# Patient Record
Sex: Female | Born: 1980 | Race: Black or African American | Hispanic: No | Marital: Married | Smoking: Current every day smoker
Health system: Southern US, Community
[De-identification: ages and names within clinical notes are randomized; demographics above are authoritative.]

## PROBLEM LIST (undated history)

## (undated) HISTORY — PX: TUBAL LIGATION: SHX77

---

## 2001-01-26 ENCOUNTER — Emergency Department (HOSPITAL_COMMUNITY): Admission: EM | Admit: 2001-01-26 | Discharge: 2001-01-26 | Payer: Self-pay | Admitting: Emergency Medicine

## 2001-02-08 ENCOUNTER — Emergency Department (HOSPITAL_COMMUNITY): Admission: EM | Admit: 2001-02-08 | Discharge: 2001-02-09 | Payer: Self-pay | Admitting: *Deleted

## 2001-04-30 ENCOUNTER — Emergency Department (HOSPITAL_COMMUNITY): Admission: EM | Admit: 2001-04-30 | Discharge: 2001-04-30 | Payer: Self-pay | Admitting: Emergency Medicine

## 2001-07-11 ENCOUNTER — Emergency Department (HOSPITAL_COMMUNITY): Admission: EM | Admit: 2001-07-11 | Discharge: 2001-07-11 | Payer: Self-pay | Admitting: Emergency Medicine

## 2004-07-15 ENCOUNTER — Emergency Department (HOSPITAL_COMMUNITY): Admission: EM | Admit: 2004-07-15 | Discharge: 2004-07-15 | Payer: Self-pay | Admitting: Emergency Medicine

## 2004-09-25 ENCOUNTER — Emergency Department (HOSPITAL_COMMUNITY): Admission: EM | Admit: 2004-09-25 | Discharge: 2004-09-25 | Payer: Self-pay | Admitting: Emergency Medicine

## 2005-06-07 ENCOUNTER — Emergency Department (HOSPITAL_COMMUNITY): Admission: EM | Admit: 2005-06-07 | Discharge: 2005-06-07 | Payer: Self-pay | Admitting: Family Medicine

## 2005-10-04 ENCOUNTER — Emergency Department (HOSPITAL_COMMUNITY): Admission: EM | Admit: 2005-10-04 | Discharge: 2005-10-04 | Payer: Self-pay | Admitting: Emergency Medicine

## 2007-06-05 ENCOUNTER — Emergency Department (HOSPITAL_COMMUNITY): Admission: EM | Admit: 2007-06-05 | Discharge: 2007-06-05 | Payer: Self-pay | Admitting: Family Medicine

## 2009-04-12 ENCOUNTER — Emergency Department (HOSPITAL_COMMUNITY): Admission: EM | Admit: 2009-04-12 | Discharge: 2009-04-12 | Payer: Self-pay

## 2011-03-13 ENCOUNTER — Emergency Department (HOSPITAL_COMMUNITY): Payer: Medicaid Other

## 2011-03-13 ENCOUNTER — Emergency Department (HOSPITAL_COMMUNITY)
Admission: EM | Admit: 2011-03-13 | Discharge: 2011-03-13 | Disposition: A | Discharge status: Home / Self Care | Payer: Medicaid Other | Attending: Emergency Medicine | Admitting: Emergency Medicine

## 2011-03-13 DIAGNOSIS — N6009 Solitary cyst of unspecified breast: Secondary | ICD-10-CM | POA: Insufficient documentation

## 2014-09-28 ENCOUNTER — Encounter (HOSPITAL_COMMUNITY): Payer: Self-pay | Admitting: Emergency Medicine

## 2014-09-28 ENCOUNTER — Emergency Department (HOSPITAL_COMMUNITY)
Admission: EM | Admit: 2014-09-28 | Discharge: 2014-09-28 | Disposition: A | Discharge status: Home / Self Care | Payer: Medicaid Other | Attending: Emergency Medicine | Admitting: Emergency Medicine

## 2014-09-28 DIAGNOSIS — I10 Essential (primary) hypertension: Secondary | ICD-10-CM

## 2014-09-28 DIAGNOSIS — K0889 Other specified disorders of teeth and supporting structures: Secondary | ICD-10-CM

## 2014-09-28 DIAGNOSIS — K088 Other specified disorders of teeth and supporting structures: Secondary | ICD-10-CM | POA: Diagnosis present

## 2014-09-28 MED ORDER — HYDROCODONE-ACETAMINOPHEN 5-325 MG PO TABS: 1.0000 | ORAL_TABLET | ORAL | Status: AC | PRN

## 2014-09-28 MED ORDER — HYDROCHLOROTHIAZIDE 25 MG PO TABS: 25.0000 mg | ORAL_TABLET | Freq: Every day | ORAL | Status: AC

## 2014-09-28 MED ORDER — OXYCODONE-ACETAMINOPHEN 5-325 MG PO TABS
1.0000 | ORAL_TABLET | Freq: Once | ORAL | Status: AC
  Administered 2014-09-28: 1 via ORAL
  Filled 2014-09-28: qty 1

## 2014-09-28 MED ORDER — IBUPROFEN 800 MG PO TABS: 800.0000 mg | ORAL_TABLET | Freq: Three times a day (TID) | ORAL | Status: AC | PRN

## 2014-09-28 MED ORDER — PENICILLIN V POTASSIUM 500 MG PO TABS: 500.0000 mg | ORAL_TABLET | Freq: Four times a day (QID) | ORAL | Status: AC

## 2014-09-28 NOTE — ED Notes (Signed)
Started 2 days ago with right lower dental pain. "I have a hole in my tooth". Pt is tearful, BP elevated.

## 2014-09-28 NOTE — Discharge Instructions (Signed)
Read the information below.  Use the prescribed medication as directed.  Please discuss all new medications with your pharmacist.  Do not take additional tylenol while taking the prescribed pain medication to avoid overdose.  You may return to the Emergency Department at any time for worsening condition or any new symptoms that concern you.  If there is any possibility that you might be pregnant, please let your health care provider know and discuss this with the pharmacist to ensure medication safety.    Please call the dentist within 48 hours to schedule a close follow up appointment.  If you develop fevers, swelling in your face, difficulty swallowing or breathing, return to the ER immediately for a recheck.   Please have your blood pressure rechecked within one week while your pain is controlled.  Follow up with a primary care provider to reevaluate your for hypertension (high blood pressure).    Dental Pain A tooth ache may be caused by cavities (tooth decay). Cavities expose the nerve of the tooth to air and hot or cold temperatures. It may come from an infection or abscess (also called a boil or furuncle) around your tooth. It is also often caused by dental caries (tooth decay). This causes the pain you are having. DIAGNOSIS  Your caregiver can diagnose this problem by exam. TREATMENT   If caused by an infection, it may be treated with medications which kill germs (antibiotics) and pain medications as prescribed by your caregiver. Take medications as directed.  Only take over-the-counter or prescription medicines for pain, discomfort, or fever as directed by your caregiver.  Whether the tooth ache today is caused by infection or dental disease, you should see your dentist as soon as possible for further care. SEEK MEDICAL CARE IF: The exam and treatment you received today has been provided on an emergency basis only. This is not a substitute for complete medical or dental care. If your  problem worsens or new problems (symptoms) appear, and you are unable to meet with your dentist, call or return to this location. SEEK IMMEDIATE MEDICAL CARE IF:   You have a fever.  You develop redness and swelling of your face, jaw, or neck.  You are unable to open your mouth.  You have severe pain uncontrolled by pain medicine. MAKE SURE YOU:   Understand these instructions.  Will watch your condition.  Will get help right away if you are not doing well or get worse. Document Released: 07/22/2005 Document Revised: 10/14/2011 Document Reviewed: 03/09/2008 [facility] Patient Information 2015 [place] [employer], [place]. This information is not intended to replace advice given to you by your health care provider. Make sure you discuss any questions you have with your health care provider.    Emergency Department Resource Guide 1) Find a Doctor and Pay Out of Pocket Although you won't have to find out who is covered by your insurance plan, it is a good idea to ask around and get recommendations. You will then need to call the office and see if the doctor you have chosen will accept you as a new patient and what types of options they offer for patients who are self-pay. Some doctors offer discounts or will set up payment plans for their patients who do not have insurance, but you will need to ask so you aren't surprised when you get to your appointment.  2) Contact Your Local Health Department Not all health departments have doctors that can see patients for sick visits, but many do, so  it is worth a call to see if yours does. If you don't know where your local health department is, you can check in your phone book. The CDC also has a tool to help you locate your state's health department, and many state websites also have listings of all of their local health departments.  3) Find a Walk-in Clinic If your illness is not likely to be very severe or complicated, you may want to try a walk in clinic.  These are popping up all over the country in pharmacies, drugstores, and shopping centers. They're usually staffed by nurse practitioners or physician assistants that have been trained to treat common illnesses and complaints. They're usually fairly quick and inexpensive. However, if you have serious medical issues or chronic medical problems, these are probably not your best option.  No Primary Care Doctor: - Call Health Connect at  [phone] - they can help you locate a primary care doctor that  accepts your insurance, provides certain services, etc. - Physician Referral Service- [phone]  Chronic Pain Problems: Organization         Address  Phone   Notes  [facility]  [phone] Patients need to be referred by their primary care doctor.   Medication Assistance: Organization         Address  Phone   Notes  [facility] Medication [facility] [address] [place] [place], [place] [address] [phone] --Must be a resident of [facility] -- Must have NO insurance coverage whatsoever (no Medicaid/ Medicare, etc.) -- The pt. MUST have a primary care doctor that directs their care regularly and follows them in the community   [facility]  [phone]   [employer]  [phone]    Agencies that provide inexpensive medical care: Organization         Address  Phone   Notes  [facility]  [phone]   [facility]    [phone]   [facility] [address] [place], [place] [address] [phone]   [facility] [address] [place] [phone]   Planned Parenthood    [phone]   [facility]    [phone]   [facility]  [address], [place] Phone:  [phone], Fax:  [phone] Hours of Operation:  9 am - 6 pm, M-F.  Also accepts Medicaid/Medicare and self-pay.  [facility]  [address], [place] Phone: [phone], Fax: [phone]. Hours of Operation:  8:30 am - 5:30 pm, M-F.  Also accepts Medicaid and self-pay.  [facility] [place] [address], [place] Phone: [phone]   [facility] [address] Natasha Bence [place], [place] [phone], Ext. [phone] Mondays & Thursdays: 7-9 AM.  First 15 patients are seen on a first come, first serve basis.    Medicaid-accepting [facility] Providers:  Organization         Address  Phone   Notes  [facility] [address], [place] [phone] Also accepts self-pay patients.  [facility] [address] Laurell Josephs [place], [place]  [phone]   [facility] [address], [place] [phone]   [facility] [address] [place] [phone]   Renaye Rakers [address], [place]   [phone] Only accepts [place] patients after they have their name applied to their card.   Self-Pay (no insurance) in [facility]:  Organization         Address  Phone   Notes  Sickle Cell Patients, [facility] [address] [place], [place] [phone]   [facility] [address] [place], [place] [phone]   [facility] [place]  [address], [place] [phone]   [facility]/Dr. Osei-Bonsu  [address] [place] or [address], [place] [phone] Phone number for both [place] and [place] locations is the same.  [facility] [address], [place] [phone]   [facility] [address] [place] or [address] [phone]   [facility] [address] [place] [phone], phone; [phone], fax Sees patients 1st  and 3rd Saturday of every month.  Must not qualify for public or private insurance (i.e. Medicaid, Medicare, [place] Health Choice, Veterans' Benefits)  Household income should be no more than 200% of the poverty level The clinic cannot treat you if you are pregnant or think you are pregnant  Sexually transmitted diseases are not treated at the clinic.    Dental Care: Organization         Address  Phone  Notes  [facility] [address] [place], [place] [phone] Accepts children up to age 76 who are enrolled in [place] or [place] Health Choice; pregnant women with a Medicaid card; and children who have applied for Medicaid or [place] Health Choice, but were declined, whose parents can pay a reduced fee at time of service.  [facility]  [address], [place] [phone] Accepts children up to age 34 who are enrolled in [place] or [place] Health Choice; pregnant women with a Medicaid card; and children who have applied for Medicaid or [place] Health Choice, but were declined, whose parents can pay a reduced fee at time of service.  [facility]  [address] [place], [place] [phone] Patients are seen by appointment only. Walk-ins are not accepted. [facility] will see patients 54 years of age and older. Monday - Tuesday (8am-5pm) Most Wednesdays (8:30-5pm) $30 per visit, cash only  [facility] Adult Dental Access PRO[facility]  [address], [facility] [phone] Patients are seen by appointment only. Walk-ins are not accepted. [facility] will see patients 49 years of age and older. One Wednesday Evening (Monthly: Volunteer Based).  $30 per visit, cash only  [employer]  [phone] for adults; Children under age 62, call Graduate Pediatric Dentistry at [phone]. Children aged 68-14, please call [phone] to request a pediatric  application.  Dental services are provided in all areas of dental care including fillings, crowns and bridges, complete and partial dentures, implants, gum treatment, root canals, and extractions. Preventive care is also provided. Treatment is provided to both adults and children. Patients are selected via a lottery and there is often a waiting list.   [facility] [address], [place]  [phone] [email or web address]   [facility] [address] [place], [place] [phone], Ext. [phone] Second and Fourth  Thursday of each month, opens at 6:30 AM; Clinic ends at 9 AM.  Patients are seen on a first-come first-served basis, and a limited number are seen during each clinic.   [facility]  [address] Ether Griffins [place], [place] [phone]   Eligibility Requirements You must have lived in [place], [place], or [place] counties for at least the last three months.   You cannot be eligible for state or federal sponsored [place], including [employer], [place], or [employer].   You generally cannot be eligible for healthcare insurance through your employer.    How to apply: Eligibility screenings are held every Tuesday and Wednesday afternoon from 1:00 pm until 4:00 pm. You do not need an appointment for the interview!  [facility] [address], [place], [place] [phone]   [facility]  [phone]   [facility] Health Department  [phone]   [facility] Health Department  [phone]    Behavioral Health Resources in the Community: Intensive Outpatient Programs Organization         Address  Phone  Notes  [facility] Services [address] [place] [address] [place], [place] [phone]   [facility] Outpatient [address], [place], [place] [phone]   [facility] [address] [place], [place]  [phone]   [facility] Mental Health  [address]  [place], [place] [phone] or [phone]   Substance Abuse Resources Organization         Address  Phone  Notes  [facility] Drug Services  [phone]   [facility]  [phone]   [facility] [place]  [phone]   Floydene Flock  [phone]   Residential & Outpatient Substance Abuse Program  [phone]   Psychological Services Organization         Address  Phone  Notes  [facility] Health  [phone]   [facility] Services  [phone]   [facility] Mental Health [address], [place] [phone] or [phone]    Mobile Crisis Teams Organization         Address  Phone  Notes  [facility], Mobile Crisis Care Unit  [phone]   [facility]  [address] [place], [place] [phone]   Doristine Locks [address] [place] [phone]    Self-Help/Support Groups Organization         Address  Phone             Notes  [facility] [place] - variety of support groups  336- I7437963 Call for more information  Narcotics Anonymous (NA), [facility] [address], [employer] [place]  2 meetings at this location   Statistician         Address  Phone  Notes  [facility] [address],    [place]  [phone]   [facility]  [address] [place] [address], [place], [place] [phone]   [facility] [address] [place], [place] [phone] Admissions: 8am-3pm M-F  [facility] [address],    [place], [place] [phone]   [facility] [address] Starling Manns [place], [place] [phone]   [facility] [address]  [place], [place] [phone]   [facility] - Intensive Outpatient [address] Laurell Joseph[address], [place], [place] [phone]   [facility] ([facility] [phone] [address]  [place], [place] [phone] or [phone]   [facility] ([facility]) [address], [place], [place] Accepts Medicaid  [facility] [place] [address],  [place] [phone] Substance Abuse/Addiction Treatment   [facility] Organization         Address  Phone  Notes  [facility]  [phone]   Domenic Schwab, PhD [address] Arlis Porta [place], [place]   [phone] or [phone]   [place], [place] [phone]   [address], [place], [place] [phone] Insurance/Medicaid/sponsorship through [facility] [address]                                    [place], [place] [phone] [place] [address], [place] [phone]    Dr. Adele Schilder  [phone]   [facility] Dept. 1) [address] [place] 2) [place] [place] [address])  [address], [place] [phone]  [phone]   [place] [phone] or [phone] (After Hours)

## 2014-09-28 NOTE — ED Notes (Signed)
PA notified of BP 

## 2014-09-28 NOTE — ED Provider Notes (Signed)
CSN: [id number]     Arrival date & time 09/28/14  4098 History  This chart was scribed for Trixie Dredge, PA-C, working with Richardean Canal, MD by Leona Carry, ED Scribe. The patient was seen in TR10C/TR10C. The patient's care was started at 10:02 AM.     Chief Complaint  Patient presents with  . Dental Pain   HPI HPI Comments: Hannah Carter is a 34 y.o. female who presents to the Emergency Department complaining of severe, sharp dental pain beginning 3-4 days ago. She characterizes the pain as a 10/10 in severity. Patient reports that she has "had a hole" in one of her lower right molars but denies any pain until 3-4 days ago. Patient states that the pain radiates from her right jaw to her ear. She denies nausea, vomiting, or trouble breathing, trouble swallowing, fever, or chills.  Patient does not have a PCP.   No past medical history on file. No past surgical history on file. No family history on file. History  Substance Use Topics  . Smoking status: Not on file  . Smokeless tobacco: Not on file  . Alcohol Use: Not on file   OB History    No data available     Review of Systems  Constitutional: Negative for fever and chills.  HENT: Positive for dental problem and facial swelling. Negative for trouble swallowing.   Respiratory: Negative for shortness of breath.   Gastrointestinal: Negative for nausea and vomiting.  Musculoskeletal: Negative for neck pain and neck stiffness.  Skin: Negative for color change.  Allergic/Immunologic: Negative for immunocompromised state.  Psychiatric/Behavioral: Negative for self-injury.      Allergies  Review of patient's allergies indicates not on file.  Home Medications   Prior to Admission medications   Not on File   Triage Vitals: BP 193/117 mmHg  Pulse 96  Temp(Src) 98.3 F (36.8 C) (Oral)  Resp 20  SpO2 100%  LMP 09/16/2014 Physical Exam  Constitutional: She appears well-developed and well-nourished. No distress.   HENT:  Head: Normocephalic and atraumatic.  Right Ear: Tympanic membrane and ear canal normal.  Mouth/Throat: Uvula is midline and oropharynx is clear and moist. Mucous membranes are not dry. No uvula swelling. No oropharyngeal exudate, posterior oropharyngeal edema, posterior oropharyngeal erythema or tonsillar abscesses.  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 No sinus tenderness.  Neck: Trachea normal, normal range of motion and phonation normal. Neck supple. No tracheal tenderness present. No rigidity. No tracheal deviation, no edema, no erythema and normal range of motion present.  Pulmonary/Chest: No stridor.  Lymphadenopathy:    She has no cervical adenopathy.  Neurological: She is alert.  Skin: She is not diaphoretic.  Nursing note and vitals reviewed.   ED Course  Procedures (including critical care time) DIAGNOSTIC STUDIES: Oxygen Saturation is 100% on room air, normal by my interpretation.    COORDINATION OF CARE: 10:06 AM-Discussed treatment plan which includes Percocet/Roxicet and Penicillin with pt at bedside and pt agreed to plan.    Labs Review Labs Reviewed - No data to display  Imaging Review No results found.   EKG Interpretation None      MDM   Final diagnoses:  Pain, dental  Essential hypertension    Afebrile, nontoxic patient with new dental pain.  No obvious abscess.  No concerning findings on exam.  Doubt deep space head or neck infection.  Doubt Ludwig's angina.  D/C home with antibiotic, pain medication and dental follow up.  There is  no dentist or oral surgeon on call, resources given.  Discussed findings, treatment, and follow up  with patient.  Pt given return precautions.  Pt verbalizes understanding and agrees with plan.       I personally performed the services described in this documentation, which was scribed in my presence. The recorded information has been reviewed and is accurate.    Trixie Dredgemily Nabiha Planck, PA-C 09/28/14 1054  Richardean Canalavid H Yao,  MD 09/29/14 [phone]

## 2019-07-27 ENCOUNTER — Ambulatory Visit: Payer: Medicaid Other | Attending: Internal Medicine

## 2019-07-27 DIAGNOSIS — Z20822 Contact with and (suspected) exposure to covid-19: Secondary | ICD-10-CM

## 2019-07-29 LAB — NOVEL CORONAVIRUS, NAA: SARS-CoV-2, NAA: NOT DETECTED

## 2019-10-29 ENCOUNTER — Encounter (HOSPITAL_COMMUNITY): Payer: Self-pay

## 2019-10-29 ENCOUNTER — Ambulatory Visit (HOSPITAL_COMMUNITY)
Admission: EM | Admit: 2019-10-29 | Discharge: 2019-10-29 | Disposition: A | Discharge status: Home / Self Care | Payer: Medicaid Other

## 2019-10-29 ENCOUNTER — Ambulatory Visit (INDEPENDENT_AMBULATORY_CARE_PROVIDER_SITE_OTHER): Payer: Medicaid Other

## 2019-10-29 ENCOUNTER — Other Ambulatory Visit: Payer: Self-pay

## 2019-10-29 DIAGNOSIS — R0781 Pleurodynia: Secondary | ICD-10-CM

## 2019-10-29 DIAGNOSIS — R079 Chest pain, unspecified: Secondary | ICD-10-CM

## 2019-10-29 MED ORDER — CYCLOBENZAPRINE HCL 5 MG PO TABS: 5.0000 mg | ORAL_TABLET | Freq: Every day | ORAL | 0 refills | Status: AC

## 2019-10-29 MED ORDER — NAPROXEN 500 MG PO TABS: 500.0000 mg | ORAL_TABLET | Freq: Two times a day (BID) | ORAL | 0 refills | Status: AC

## 2019-10-29 NOTE — Discharge Instructions (Signed)
Your xray is normal today.  We will try an anti-inflammatory and muscle relaxer to see if this is helpful with your symptoms.  Take the muscle relaxer at night as may cause drowsiness.  Naproxen may be taken at night, or may help to be taken twice a day. Take with food to prevent stomach upset.  Follow up with a primary care provider for recheck if symptoms persist.

## 2019-10-29 NOTE — ED Triage Notes (Signed)
Pt c/o "aching" left thoracic and left rib pain for approx 1 month. States she cannot recall a direct injury or trauma to the area. Pain increases with raising left arm. Denies SOB, cough, or numbness to left arm. Pt able to twist as describes location.

## 2019-10-29 NOTE — ED Provider Notes (Signed)
[place]    CSN: [id number] Arrival date & time: 10/29/19  9622      History   Chief Complaint Chief Complaint  Patient presents with  . Back Pain    HPI Hannah Carter is a 39 y.o. female.   Hannah Carter presents with complaints of left lateral rib pain which she has been experiencing for months now which is worse at night primarily. Certain positions and movements seem to trigger it. Has tried tylenol which hasn't helped with pain. No specific known injury. She does not work. No shortness of breath , no dizziness. Only mild pain currently but noted it last night, preventing her from sleeping well. No anterior chest pain, no heart burn. No cardiac history.     ROS per HPI, negative if not otherwise mentioned.      History reviewed. No pertinent past medical history.  There are no problems to display for this patient.   Past Surgical History:  Procedure Laterality Date  . TUBAL LIGATION      OB History   No obstetric history on file.      Home Medications    Prior to Admission medications   Medication Sig Start Date End Date Taking? Authorizing Provider  acetaminophen (TYLENOL) 500 MG tablet Take 500 mg by mouth every 6 (six) hours as needed.   Yes [provider]  cyclobenzaprine (FLEXERIL) 5 MG tablet Take 1 tablet (5 mg total) by mouth at bedtime. 10/29/19   Zigmund Gottron, NP  hydrochlorothiazide (HYDRODIURIL) 25 MG tablet Take 1 tablet (25 mg total) by mouth daily. 09/28/14   Clayton Bibles, PA-C  HYDROcodone-acetaminophen (NORCO/VICODIN) 5-325 MG per tablet Take 1 tablet by mouth every 4 (four) hours as needed for moderate pain or severe pain. 09/28/14   Clayton Bibles, PA-C  ibuprofen (ADVIL,MOTRIN) 800 MG tablet Take 1 tablet (800 mg total) by mouth every 8 (eight) hours as needed for mild pain or moderate pain. 09/28/14   Clayton Bibles, PA-C  naproxen (NAPROSYN) 500 MG tablet Take 1 tablet (500 mg total) by mouth 2 (two) times daily.  10/29/19   Zigmund Gottron, NP    Family History Family History  Problem Relation Age of Onset  . Healthy Mother   . Heart failure Father     Social History Social History   Tobacco Use  . Smoking status: Current Every Day Smoker  Substance Use Topics  . Alcohol use: Yes  . Drug use: Yes    Types: Marijuana     Allergies   Patient has no known allergies.   Review of Systems Review of Systems   Physical Exam Triage Vital Signs ED Triage Vitals  Enc Vitals Group     BP 10/29/19 0836 136/89     Pulse Rate 10/29/19 0836 93     Resp 10/29/19 0836 16     Temp 10/29/19 0836 97.9 F (36.6 C)     Temp Source 10/29/19 0836 Oral     SpO2 10/29/19 0836 100 %     Weight --      Height --      Head Circumference --      Peak Flow --      Pain Score 10/29/19 0832 6     Pain Loc --      Pain Edu? --      Excl. in [place]? --    No data found.  Updated Vital Signs BP 136/89 (BP Location: Left Arm)   Pulse  93   Temp 97.9 F (36.6 C) (Oral)   Resp 16   LMP 10/08/2019 (Exact Date)   SpO2 100%    Physical Exam Constitutional:      General: She is not in acute distress.    Appearance: She is well-developed.  Cardiovascular:     Rate and Rhythm: Normal rate and regular rhythm.  Pulmonary:     Effort: Pulmonary effort is normal.     Breath sounds: Normal breath sounds.  Chest:     Chest wall: Tenderness present. No crepitus or edema. There is no dullness to percussion.       Comments: Left lateral ribs and musculature with tenderness on palpation  Skin:    General: Skin is warm and dry.  Neurological:     Mental Status: She is alert and oriented to person, place, and time.      UC Treatments / Results  Labs (all labs ordered are listed, but only abnormal results are displayed) Labs Reviewed - No data to display  EKG   Radiology DG Ribs Unilateral W/Chest Left  Result Date: 10/29/2019 CLINICAL DATA:  Pain EXAM: LEFT RIBS AND CHEST - 3+ VIEW COMPARISON:   Chest radiograph March 13, 2011. FINDINGS: Frontal chest as well as oblique and cone-down rib images obtained. Lungs are clear. Heart size and pulmonary vascularity are normal. No adenopathy. There is no evident pneumothorax or pleural effusion. No evident rib fracture. IMPRESSION: No evident rib fracture.  Lungs clear. Electronically Signed   By: Bretta Bang III M.D.   On: 10/29/2019 09:08    Procedures Procedures (including critical care time)  Medications Ordered in UC Medications - No data to display  Initial Impression / Assessment and Plan / UC Course  I have reviewed the triage vital signs and the nursing notes.  Pertinent labs & imaging results that were available during my care of the patient were reviewed by me and considered in my medical decision making (see chart for details).     Rib/ chest film normal today. Symptoms for months now. Primarily at night to left lateral chest wall. Reproducible with palpation during exam. Worse with movement over night. Pain management discussed. Encouraged establish with PCP. Return precautions provided. Patient verbalized understanding and agreeable to plan.   Final Clinical Impressions(s) / UC Diagnoses   Final diagnoses:  Rib pain on left side     Discharge Instructions     Your xray is normal today.  We will try an anti-inflammatory and muscle relaxer to see if this is helpful with your symptoms.  Take the muscle relaxer at night as may cause drowsiness.  Naproxen may be taken at night, or may help to be taken twice a day. Take with food to prevent stomach upset.  Follow up with a primary care provider for recheck if symptoms persist.    ED Prescriptions    Medication Sig Dispense Auth. Provider   cyclobenzaprine (FLEXERIL) 5 MG tablet Take 1 tablet (5 mg total) by mouth at bedtime. 15 tablet Linus Mako B, NP   naproxen (NAPROSYN) 500 MG tablet Take 1 tablet (500 mg total) by mouth 2 (two) times daily. 30 tablet Georgetta Haber, NP     PDMP not reviewed this encounter.   Georgetta Haber, NP 10/29/19 1022

## 2020-02-03 DIAGNOSIS — Z419 Encounter for procedure for purposes other than remedying health state, unspecified: Secondary | ICD-10-CM | POA: Diagnosis not present

## 2020-03-05 DIAGNOSIS — Z419 Encounter for procedure for purposes other than remedying health state, unspecified: Secondary | ICD-10-CM | POA: Diagnosis not present

## 2020-04-05 DIAGNOSIS — Z419 Encounter for procedure for purposes other than remedying health state, unspecified: Secondary | ICD-10-CM | POA: Diagnosis not present

## 2020-05-05 DIAGNOSIS — Z419 Encounter for procedure for purposes other than remedying health state, unspecified: Secondary | ICD-10-CM | POA: Diagnosis not present

## 2020-06-05 DIAGNOSIS — Z419 Encounter for procedure for purposes other than remedying health state, unspecified: Secondary | ICD-10-CM | POA: Diagnosis not present

## 2020-07-05 DIAGNOSIS — Z419 Encounter for procedure for purposes other than remedying health state, unspecified: Secondary | ICD-10-CM | POA: Diagnosis not present

## 2020-08-05 DIAGNOSIS — Z419 Encounter for procedure for purposes other than remedying health state, unspecified: Secondary | ICD-10-CM | POA: Diagnosis not present

## 2020-09-05 DIAGNOSIS — Z419 Encounter for procedure for purposes other than remedying health state, unspecified: Secondary | ICD-10-CM | POA: Diagnosis not present

## 2020-10-03 DIAGNOSIS — Z419 Encounter for procedure for purposes other than remedying health state, unspecified: Secondary | ICD-10-CM | POA: Diagnosis not present

## 2020-11-03 DIAGNOSIS — Z419 Encounter for procedure for purposes other than remedying health state, unspecified: Secondary | ICD-10-CM | POA: Diagnosis not present

## 2020-12-03 DIAGNOSIS — Z419 Encounter for procedure for purposes other than remedying health state, unspecified: Secondary | ICD-10-CM | POA: Diagnosis not present

## 2020-12-04 IMAGING — DX DG RIBS W/ CHEST 3+V*L*
3 series · 3 of 3 positions shown · non-contrast
Comparison: Chest radiograph March 13, 2011.

CLINICAL DATA: Pain

EXAM:
LEFT RIBS AND CHEST - 3+ VIEW

[chest pa]
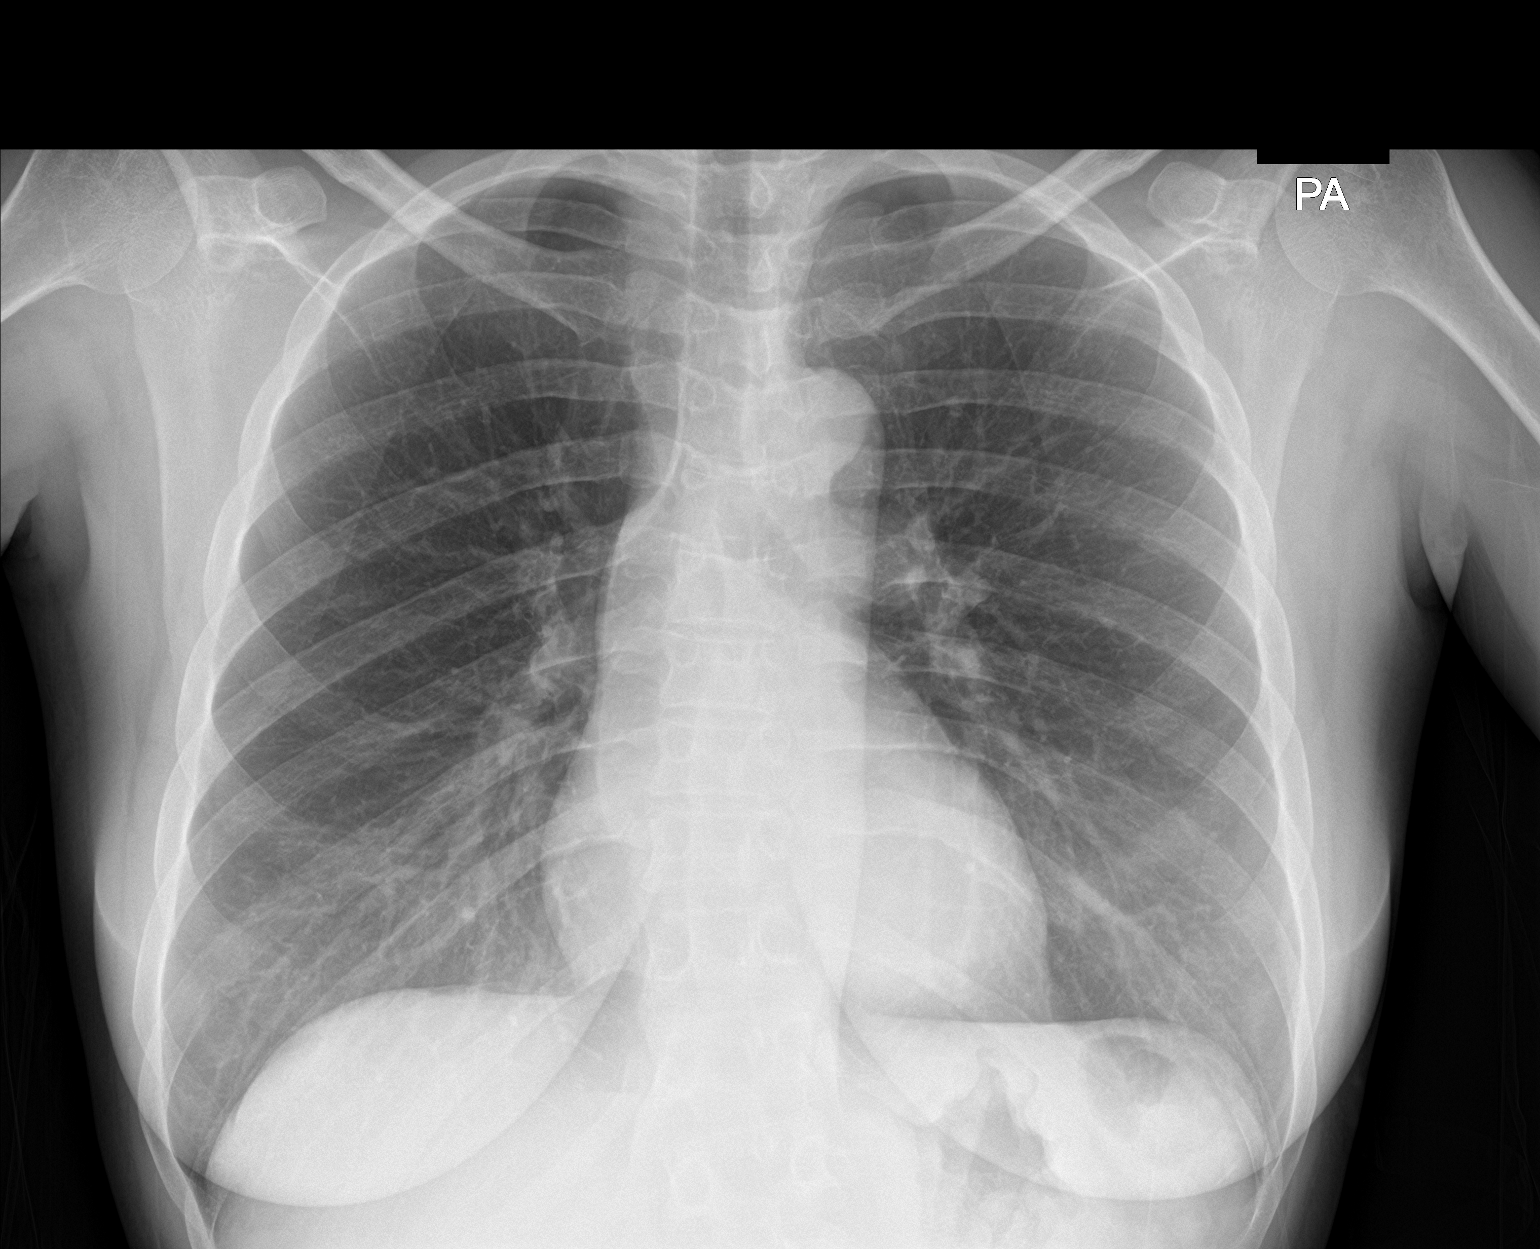

[rib obl]
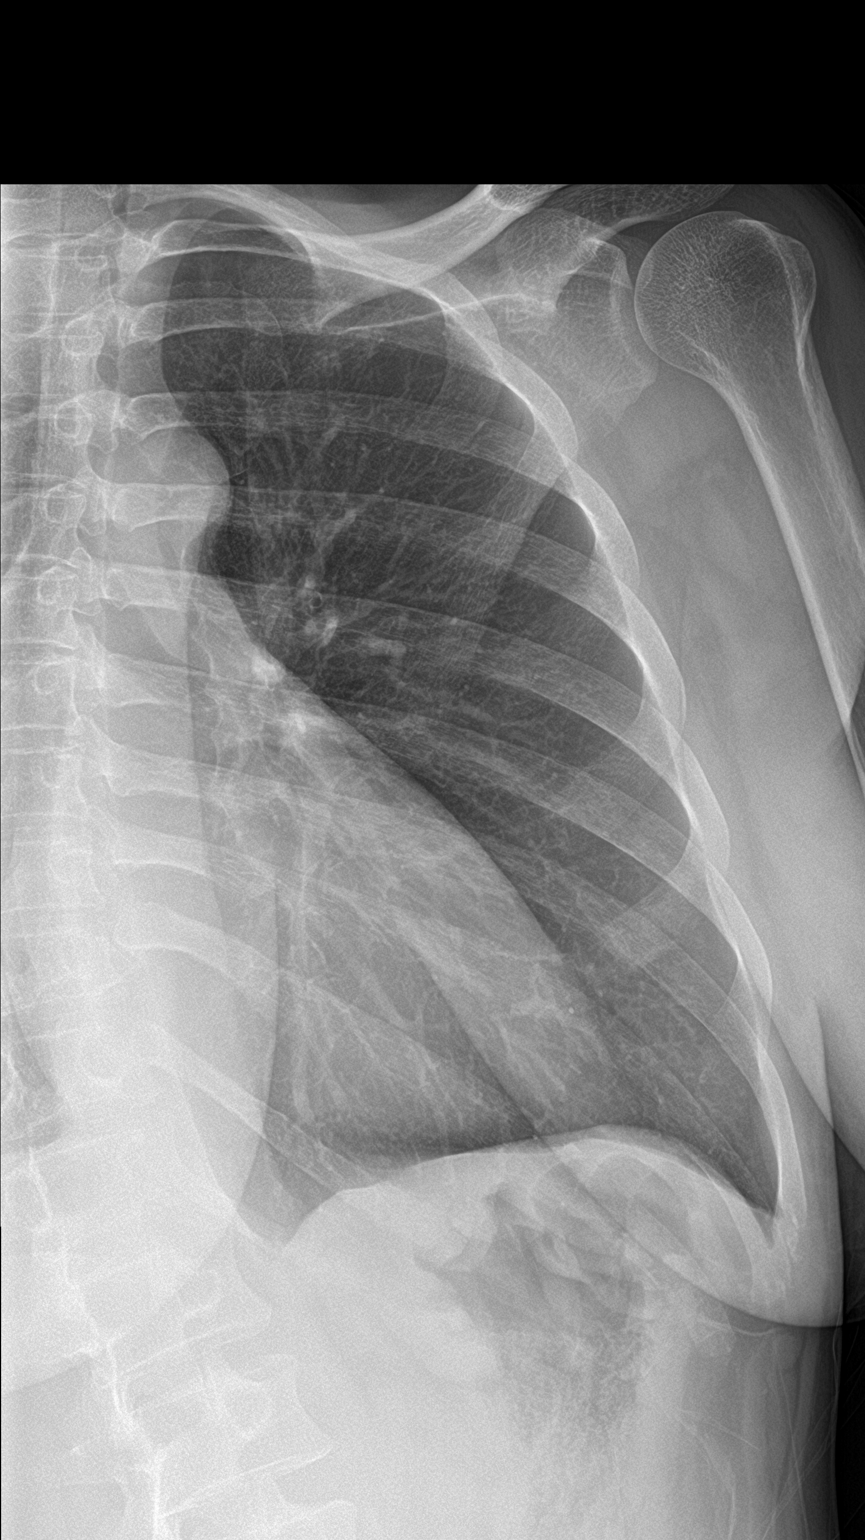

[rib pa]
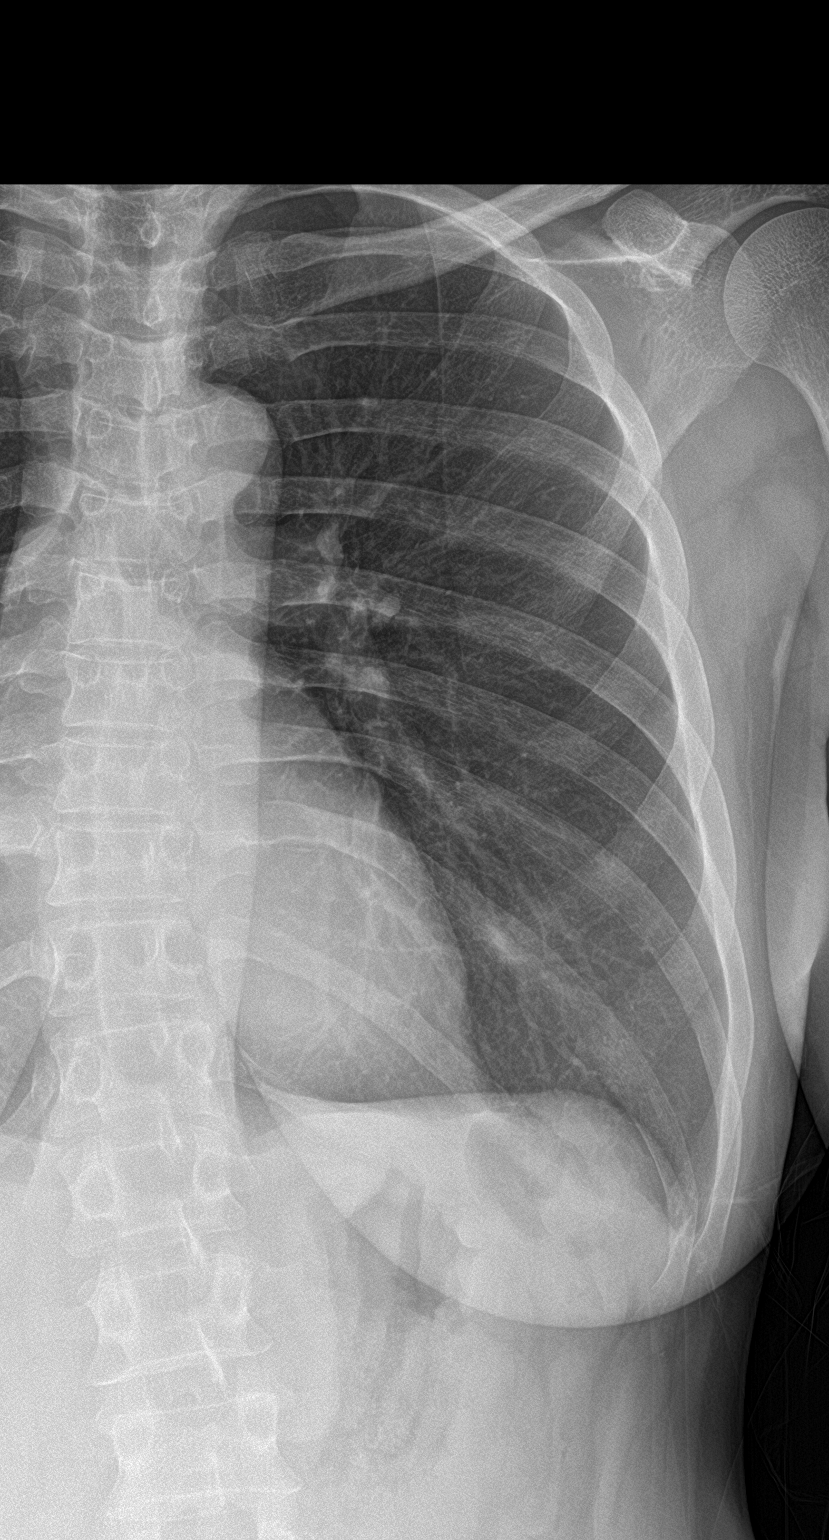

[3 of 3 positions shown; findings below may reference images not displayed]

FINDINGS: Frontal chest as well as oblique and cone-down rib images obtained.
Lungs are clear. Heart size and pulmonary vascularity are normal. No
adenopathy. There is no evident pneumothorax or pleural effusion. No
evident rib fracture.
IMPRESSION: No evident rib fracture.  Lungs clear.

## 2021-01-03 DIAGNOSIS — Z419 Encounter for procedure for purposes other than remedying health state, unspecified: Secondary | ICD-10-CM | POA: Diagnosis not present

## 2021-02-02 DIAGNOSIS — Z419 Encounter for procedure for purposes other than remedying health state, unspecified: Secondary | ICD-10-CM | POA: Diagnosis not present

## 2021-03-05 DIAGNOSIS — Z419 Encounter for procedure for purposes other than remedying health state, unspecified: Secondary | ICD-10-CM | POA: Diagnosis not present

## 2021-04-05 DIAGNOSIS — Z419 Encounter for procedure for purposes other than remedying health state, unspecified: Secondary | ICD-10-CM | POA: Diagnosis not present

## 2021-05-05 DIAGNOSIS — Z419 Encounter for procedure for purposes other than remedying health state, unspecified: Secondary | ICD-10-CM | POA: Diagnosis not present

## 2021-06-05 DIAGNOSIS — Z419 Encounter for procedure for purposes other than remedying health state, unspecified: Secondary | ICD-10-CM | POA: Diagnosis not present

## 2021-07-05 DIAGNOSIS — Z419 Encounter for procedure for purposes other than remedying health state, unspecified: Secondary | ICD-10-CM | POA: Diagnosis not present

## 2021-08-05 DIAGNOSIS — Z419 Encounter for procedure for purposes other than remedying health state, unspecified: Secondary | ICD-10-CM | POA: Diagnosis not present

## 2021-09-05 DIAGNOSIS — Z419 Encounter for procedure for purposes other than remedying health state, unspecified: Secondary | ICD-10-CM | POA: Diagnosis not present

## 2021-10-03 DIAGNOSIS — Z419 Encounter for procedure for purposes other than remedying health state, unspecified: Secondary | ICD-10-CM | POA: Diagnosis not present

## 2021-11-03 DIAGNOSIS — Z419 Encounter for procedure for purposes other than remedying health state, unspecified: Secondary | ICD-10-CM | POA: Diagnosis not present

## 2021-12-03 DIAGNOSIS — Z419 Encounter for procedure for purposes other than remedying health state, unspecified: Secondary | ICD-10-CM | POA: Diagnosis not present

## 2022-01-03 DIAGNOSIS — Z419 Encounter for procedure for purposes other than remedying health state, unspecified: Secondary | ICD-10-CM | POA: Diagnosis not present

## 2022-02-02 DIAGNOSIS — Z419 Encounter for procedure for purposes other than remedying health state, unspecified: Secondary | ICD-10-CM | POA: Diagnosis not present

## 2022-03-05 DIAGNOSIS — Z419 Encounter for procedure for purposes other than remedying health state, unspecified: Secondary | ICD-10-CM | POA: Diagnosis not present

## 2022-04-05 DIAGNOSIS — Z419 Encounter for procedure for purposes other than remedying health state, unspecified: Secondary | ICD-10-CM | POA: Diagnosis not present

## 2022-05-05 DIAGNOSIS — Z419 Encounter for procedure for purposes other than remedying health state, unspecified: Secondary | ICD-10-CM | POA: Diagnosis not present

## 2022-06-05 DIAGNOSIS — Z419 Encounter for procedure for purposes other than remedying health state, unspecified: Secondary | ICD-10-CM | POA: Diagnosis not present

## 2022-07-05 DIAGNOSIS — Z419 Encounter for procedure for purposes other than remedying health state, unspecified: Secondary | ICD-10-CM | POA: Diagnosis not present

## 2022-08-05 DIAGNOSIS — Z419 Encounter for procedure for purposes other than remedying health state, unspecified: Secondary | ICD-10-CM | POA: Diagnosis not present

## 2022-09-05 DIAGNOSIS — Z419 Encounter for procedure for purposes other than remedying health state, unspecified: Secondary | ICD-10-CM | POA: Diagnosis not present

## 2022-10-04 DIAGNOSIS — Z419 Encounter for procedure for purposes other than remedying health state, unspecified: Secondary | ICD-10-CM | POA: Diagnosis not present

## 2022-11-04 DIAGNOSIS — Z419 Encounter for procedure for purposes other than remedying health state, unspecified: Secondary | ICD-10-CM | POA: Diagnosis not present

## 2022-11-25 ENCOUNTER — Ambulatory Visit: Payer: Medicaid Other | Admitting: Podiatry

## 2022-11-25 DIAGNOSIS — M79675 Pain in left toe(s): Secondary | ICD-10-CM | POA: Diagnosis not present

## 2022-11-25 DIAGNOSIS — B353 Tinea pedis: Secondary | ICD-10-CM

## 2022-11-25 DIAGNOSIS — B351 Tinea unguium: Secondary | ICD-10-CM

## 2022-11-25 DIAGNOSIS — M79674 Pain in right toe(s): Secondary | ICD-10-CM | POA: Diagnosis not present

## 2022-11-25 MED ORDER — KETOCONAZOLE 2 % EX CREA: 1.0000 | TOPICAL_CREAM | Freq: Every day | CUTANEOUS | 2 refills | Status: AC

## 2022-11-25 NOTE — Progress Notes (Signed)
  Subjective:  Patient ID: Hannah Carter, female    DOB: 04-12-1981,   MRN: [id number]  Chief Complaint  Patient presents with   Nail Problem     Bilateral athletes feet , routine foot care. Patient states her feet are very itchy      42 y.o. female presents for concern of bilateral athletes foot that has been going on for years. Relates her feet are very dry and itching also relates she is unable to trim her own toenails.  . Denies any other pedal complaints. Denies n/v/f/c.   No past medical history on file.  Objective:  Physical Exam: Vascular: DP/PT pulses 2/4 bilateral. CFT <3 seconds. Absent hair growth on digits. No edema noted to bilateral lower extremities. Xerosis noted bilaterally.  Skin. No lacerations or abrasions bilateral feet. Nails 1-5 bilateral  are thickened discolored and elongated with subungual debris. Scaling and erythema noted to bilateral plantar feet and in interspaces.  Musculoskeletal: MMT 5/5 bilateral lower extremities in DF, PF, Inversion and Eversion. Deceased ROM in DF of ankle joint.  Neurological: Sensation intact to light touch. Protective sensation diminished bilateral.    Assessment:   1. Tinea pedis of both feet   2. Pain due to onychomycosis of toenails of both feet      Plan:  Patient was evaluated and treated and all questions answered. -Discussed tinea pedis and treatment options.  Ketoconazole sent to pharmacy.  -Mechanically debrided all nails 1-5 bilateral using sterile nail nipper and filed with dremel without incident  as courtesy.  -Answered all patient questions -Patient to return  in  2 months for recheck of feet.  -Patient advised to call the office if any problems or questions arise in the meantime.   Louann Sjogren, DPM

## 2022-12-04 DIAGNOSIS — Z419 Encounter for procedure for purposes other than remedying health state, unspecified: Secondary | ICD-10-CM | POA: Diagnosis not present

## 2023-01-04 DIAGNOSIS — Z419 Encounter for procedure for purposes other than remedying health state, unspecified: Secondary | ICD-10-CM | POA: Diagnosis not present

## 2023-01-20 ENCOUNTER — Ambulatory Visit: Payer: Medicaid Other | Admitting: Podiatry

## 2023-01-20 DIAGNOSIS — B351 Tinea unguium: Secondary | ICD-10-CM

## 2023-01-20 DIAGNOSIS — L0889 Other specified local infections of the skin and subcutaneous tissue: Secondary | ICD-10-CM

## 2023-01-20 DIAGNOSIS — M79676 Pain in unspecified toe(s): Secondary | ICD-10-CM

## 2023-01-20 DIAGNOSIS — B353 Tinea pedis: Secondary | ICD-10-CM | POA: Diagnosis not present

## 2023-01-20 MED ORDER — CLOTRIMAZOLE-BETAMETHASONE 1-0.05 % EX CREA: 1.0000 | TOPICAL_CREAM | Freq: Every day | CUTANEOUS | 0 refills | Status: AC

## 2023-01-20 NOTE — Progress Notes (Unsigned)
    Subjective:  Patient ID: Hannah Carter, female    DOB: 1981/05/29,  MRN: [id number]   Hannah Carter presents to clinic today for:  Chief Complaint  Patient presents with   Tinea Pedis     Patient states fungus is still improving , states feet are still itchy   . Patient notes nails are thick, discolored, elongated and painful in shoegear when trying to ambulate.  Patient notes there is still itching on both feet.  Has been using ketoconazole cream.  States that overall the skin looks a little better according to her.  However the itching has not subsided.  Also has painful calluses submet 1 and submet 5 of the right foot as well as the left heel  No Known Allergies  Review of Systems: Negative except as noted in the HPI.  Objective:  There were no vitals filed for this visit.  Lark Torbeck is a pleasant 42 y.o. female in NAD. AAO x 3.  Vascular Examination: Capillary refill time is 3-5 seconds to toes bilateral. Palpable pedal pulses b/l LE. Digital hair present b/l. No pedal edema b/l. Skin temperature gradient WNL b/l. No varicosities b/l. No cyanosis or clubbing noted b/l.   Dermatological Examination: Pedal skin with normal turgor, texture and tone b/l. No open wounds. No interdigital macerations b/l. Toenails x10 are 3mm thick, discolored, dystrophic with subungual debris. There is pain with compression of the nail plates.  They are elongated x10.  There is a hyperkeratotic lesion submet 1 and 5 right foot and plantar left heel.  No ulceration noted.  Neurological Examination: Protective sensation intact bilateral LE.   Musculoskeletal Examination: Muscle strength 5/5 to all LE muscle groups b/l.   Assessment/Plan: 1. Tinea pedis of both feet   2. Dermatophytosis of nail     Meds ordered this encounter  Medications   clotrimazole-betamethasone (LOTRISONE) cream    Sig: Apply 1 Application topically daily.    Dispense:  30 g    Refill:  0   The mycotic  toenails were sharply debrided x10 with sterile nail nippers and a power debriding burr to decrease bulk/thickness and length.    The hyperkeratotic lesions were shaved with a sterile #312 blade x 3.  Will switch the patient to Lotrisone cream twice daily to address the itching and the fungus.  Once the itching has resolved she may return back to the ketoconazole cream.  Return in about 3 months (around 04/22/2023) for RFC with Dr. Ralene Cork.   Clerance Lav, DPM, FACFAS [facility]     [address] [place], [place] [address]                Office [phone]  Fax [phone]

## 2023-01-22 DIAGNOSIS — B351 Tinea unguium: Secondary | ICD-10-CM | POA: Insufficient documentation

## 2023-01-22 DIAGNOSIS — B353 Tinea pedis: Secondary | ICD-10-CM | POA: Insufficient documentation

## 2023-02-03 DIAGNOSIS — Z419 Encounter for procedure for purposes other than remedying health state, unspecified: Secondary | ICD-10-CM | POA: Diagnosis not present

## 2023-03-06 DIAGNOSIS — Z419 Encounter for procedure for purposes other than remedying health state, unspecified: Secondary | ICD-10-CM | POA: Diagnosis not present

## 2023-04-06 DIAGNOSIS — Z419 Encounter for procedure for purposes other than remedying health state, unspecified: Secondary | ICD-10-CM | POA: Diagnosis not present

## 2023-04-29 ENCOUNTER — Encounter: Payer: Self-pay | Admitting: Podiatry

## 2023-04-29 ENCOUNTER — Ambulatory Visit (INDEPENDENT_AMBULATORY_CARE_PROVIDER_SITE_OTHER): Payer: Medicaid Other | Admitting: Podiatry

## 2023-04-29 DIAGNOSIS — B351 Tinea unguium: Secondary | ICD-10-CM | POA: Diagnosis not present

## 2023-04-29 DIAGNOSIS — M79675 Pain in left toe(s): Secondary | ICD-10-CM | POA: Diagnosis not present

## 2023-04-29 DIAGNOSIS — L84 Corns and callosities: Secondary | ICD-10-CM

## 2023-04-29 DIAGNOSIS — B353 Tinea pedis: Secondary | ICD-10-CM

## 2023-04-29 DIAGNOSIS — M79674 Pain in right toe(s): Secondary | ICD-10-CM

## 2023-04-29 NOTE — Progress Notes (Signed)
Subjective:  Patient ID: Hannah Carter, female    DOB: [birth year],   MRN: [id number]  Chief Complaint  Patient presents with   Nail Problem    Dfc.    42 y.o. female presents for concern of thickened elongated and painful nails that are difficult to trim. Requesting to have them trimmed today. Also relates painful calluses requesting to have trimmed.   PCP:  Patient, No Pcp Per   .  Marland Kitchen Denies any other pedal complaints. Denies n/v/f/c.   No past medical history on file.  Objective:  Physical Exam: Vascular: DP/PT pulses 2/4 bilateral. CFT <3 seconds. Absent hair growth on digits. No edema noted to bilateral lower extremities. Xerosis noted bilaterally.  Skin. No lacerations or abrasions bilateral feet. Nails 1-5 bilateral  are thickened discolored and elongated with subungual debris. Scaling and erythema noted to bilateral plantar feet and in interspaces. Hyperkeratotic lesions noted to plantar first and fifth metataersal on right and plantar fifth metatarsal base on left.   Musculoskeletal: MMT 5/5 bilateral lower extremities in DF, PF, Inversion and Eversion. Deceased ROM in DF of ankle joint.  Neurological: Sensation intact to light touch. Protective sensation diminished bilateral.    Assessment:   1. Pain due to onychomycosis of toenails of both feet   2. Callus of foot   3. Tinea pedis of both feet      Plan:  Patient was evaluated and treated and all questions answered. -Discussed tinea pedis and treatment options.  Continue lotrisone  -ABN signed.  -Mechanically debrided all nails 1-5 bilateral using sterile nail nipper and filed with dremel without incident  -Hyperekeratotic lesions x 3 debrided with chisel without incident.  -Answered all patient questions -Patient to return  in  3 months for rfc.  -Patient advised to call the office if any problems or questions arise in the meantime.   Louann Sjogren, DPM

## 2023-05-06 DIAGNOSIS — Z419 Encounter for procedure for purposes other than remedying health state, unspecified: Secondary | ICD-10-CM | POA: Diagnosis not present

## 2023-06-06 DIAGNOSIS — Z419 Encounter for procedure for purposes other than remedying health state, unspecified: Secondary | ICD-10-CM | POA: Diagnosis not present

## 2023-07-06 DIAGNOSIS — Z419 Encounter for procedure for purposes other than remedying health state, unspecified: Secondary | ICD-10-CM | POA: Diagnosis not present

## 2023-07-29 ENCOUNTER — Ambulatory Visit: Payer: Medicaid Other | Admitting: Podiatry

## 2023-08-04 ENCOUNTER — Ambulatory Visit: Payer: Medicaid Other | Admitting: Podiatry

## 2023-08-06 DIAGNOSIS — Z419 Encounter for procedure for purposes other than remedying health state, unspecified: Secondary | ICD-10-CM | POA: Diagnosis not present

## 2023-09-06 DIAGNOSIS — Z419 Encounter for procedure for purposes other than remedying health state, unspecified: Secondary | ICD-10-CM | POA: Diagnosis not present

## 2023-09-24 ENCOUNTER — Ambulatory Visit: Payer: Medicaid Other | Admitting: Podiatry

## 2023-10-04 DIAGNOSIS — Z419 Encounter for procedure for purposes other than remedying health state, unspecified: Secondary | ICD-10-CM | POA: Diagnosis not present

## 2023-11-15 DIAGNOSIS — Z419 Encounter for procedure for purposes other than remedying health state, unspecified: Secondary | ICD-10-CM | POA: Diagnosis not present

## 2023-12-15 DIAGNOSIS — Z419 Encounter for procedure for purposes other than remedying health state, unspecified: Secondary | ICD-10-CM | POA: Diagnosis not present

## 2024-01-15 DIAGNOSIS — Z419 Encounter for procedure for purposes other than remedying health state, unspecified: Secondary | ICD-10-CM | POA: Diagnosis not present

## 2024-02-14 DIAGNOSIS — Z419 Encounter for procedure for purposes other than remedying health state, unspecified: Secondary | ICD-10-CM | POA: Diagnosis not present

## 2024-03-16 DIAGNOSIS — Z419 Encounter for procedure for purposes other than remedying health state, unspecified: Secondary | ICD-10-CM | POA: Diagnosis not present

## 2024-04-16 DIAGNOSIS — Z419 Encounter for procedure for purposes other than remedying health state, unspecified: Secondary | ICD-10-CM | POA: Diagnosis not present

## 2024-05-16 DIAGNOSIS — Z419 Encounter for procedure for purposes other than remedying health state, unspecified: Secondary | ICD-10-CM | POA: Diagnosis not present

## 2024-07-16 DIAGNOSIS — Z419 Encounter for procedure for purposes other than remedying health state, unspecified: Secondary | ICD-10-CM | POA: Diagnosis not present
